# Patient Record
Sex: Male | Born: 1965 | Race: White | Hispanic: No | Marital: Married | Smoking: Never smoker
Health system: Southern US, Community
[De-identification: ages and names within clinical notes are randomized; demographics above are authoritative.]

---

## 1998-10-03 ENCOUNTER — Ambulatory Visit (HOSPITAL_COMMUNITY): Admission: RE | Admit: 1998-10-03 | Discharge: 1998-10-03 | Payer: Self-pay | Admitting: *Deleted

## 1998-10-27 ENCOUNTER — Ambulatory Visit (HOSPITAL_COMMUNITY): Admission: RE | Admit: 1998-10-27 | Discharge: 1998-10-27 | Payer: Self-pay | Admitting: *Deleted

## 2018-04-21 ENCOUNTER — Emergency Department (HOSPITAL_COMMUNITY): Payer: PRIVATE HEALTH INSURANCE

## 2018-04-21 ENCOUNTER — Other Ambulatory Visit: Payer: Self-pay

## 2018-04-21 ENCOUNTER — Emergency Department (HOSPITAL_COMMUNITY)
Admission: EM | Admit: 2018-04-21 | Discharge: 2018-04-22 | Disposition: A | Discharge status: Home / Self Care | Payer: PRIVATE HEALTH INSURANCE | Attending: Emergency Medicine | Admitting: Emergency Medicine

## 2018-04-21 DIAGNOSIS — Y9234 Swimming pool (public) as the place of occurrence of the external cause: Secondary | ICD-10-CM | POA: Diagnosis not present

## 2018-04-21 DIAGNOSIS — Z23 Encounter for immunization: Secondary | ICD-10-CM | POA: Diagnosis not present

## 2018-04-21 DIAGNOSIS — Y9301 Activity, walking, marching and hiking: Secondary | ICD-10-CM | POA: Insufficient documentation

## 2018-04-21 DIAGNOSIS — S91112A Laceration without foreign body of left great toe without damage to nail, initial encounter: Secondary | ICD-10-CM

## 2018-04-21 DIAGNOSIS — Y999 Unspecified external cause status: Secondary | ICD-10-CM | POA: Diagnosis not present

## 2018-04-21 DIAGNOSIS — S99922A Unspecified injury of left foot, initial encounter: Secondary | ICD-10-CM | POA: Diagnosis present

## 2018-04-21 DIAGNOSIS — W109XXA Fall (on) (from) unspecified stairs and steps, initial encounter: Secondary | ICD-10-CM | POA: Insufficient documentation

## 2018-04-21 MED ORDER — LIDOCAINE HCL (PF) 1 % IJ SOLN
30.0000 mL | Freq: Once | INTRAMUSCULAR | Status: AC
  Administered 2018-04-22: 30 mL
  Filled 2018-04-21: qty 30

## 2018-04-21 NOTE — ED Triage Notes (Signed)
Pt c/o left big toe pain 3/10 throbbing. Pt tripped on some steps at the pool. Bleeding controlled.

## 2018-04-22 MED ORDER — TETANUS-DIPHTH-ACELL PERTUSSIS 5-2.5-18.5 LF-MCG/0.5 IM SUSP
0.5000 mL | Freq: Once | INTRAMUSCULAR | Status: AC
  Administered 2018-04-22: 0.5 mL via INTRAMUSCULAR
  Filled 2018-04-22: qty 0.5

## 2018-04-22 NOTE — Discharge Instructions (Addendum)

## 2018-04-22 NOTE — ED Provider Notes (Signed)
[name] DEPT Provider Note   CSN: [id number] Arrival date & time: 04/21/18  2025     History   Chief Complaint Chief Complaint  Patient presents with  . Toe Injury    HPI Antonio Boyd is a 52 y.o. male with a hx of no major medical history presents to the Emergency Department complaining of acute, persistent laceration to the left great toe.  Patient reports he was walking up a set of concrete stairs when he stumbled bending his left great toe all the way back.  He reports acute pain and bleeding.  He states he immediately applied pressure and achieved hemostasis.  Unknown last tetanus shot.  He is not on a blood thinner.  No history of immunocompromise or diabetes.  No aggravating factors.  Patient did not fall or hit his head.  He denies numbness, tingling, weakness.  He reports he was able to walk afterwards.   The history is provided by the patient and medical records. No language interpreter was used.    No past medical history on file.  There are no active problems to display for this patient.    Home Medications    Prior to Admission medications   Not on File    Family History No family history on file.  Social History Social History   Tobacco Use  . Smoking status: Not on file  Substance Use Topics  . Alcohol use: Not on file  . Drug use: Not on file     Allergies   Patient has no known allergies.   Review of Systems Review of Systems  Constitutional: Negative for fever.  Gastrointestinal: Negative for nausea and vomiting.  Musculoskeletal: Positive for arthralgias. Negative for gait problem and joint swelling.  Skin: Positive for wound.  Allergic/Immunologic: Negative for immunocompromised state.  Neurological: Negative for weakness and numbness.  Hematological: Does not bruise/bleed easily.  Psychiatric/Behavioral: The patient is not nervous/anxious.      Physical Exam Updated Vital Signs BP (!) 153/113 (BP  Location: Right Arm)   Pulse 61   Temp 98.1 F (36.7 C) (Oral)   Resp 20   Ht 6\' 3"  (1.905 m)   Wt 99.8 kg (220 lb)   SpO2 95%   BMI 27.50 kg/m   Physical Exam  Constitutional: He is oriented to person, place, and time. He appears well-developed and well-nourished. No distress.  HENT:  Head: Normocephalic and atraumatic.  Eyes: Conjunctivae are normal. No scleral icterus.  Neck: Normal range of motion.  Cardiovascular: Normal rate, regular rhythm, normal heart sounds and intact distal pulses.  No murmur heard. Capillary refill < 3 sec  Pulmonary/Chest: Effort normal and breath sounds normal. No respiratory distress.  Musculoskeletal: Normal range of motion. He exhibits no edema.  Full range of motion of the left great toe 5.7 centimeter U shaped laceration to the medial portion of the plantar surface of the left great toe  Neurological: He is alert and oriented to person, place, and time.  Sensation intact to normal touch throughout the left foot. Strength 5/5 with flexion extension of the left foot and great toe  Skin: Skin is warm and dry. He is not diaphoretic.  Psychiatric: He has a normal mood and affect.  Nursing note and vitals reviewed.    ED Treatments / Results   Radiology Dg Toe Great Left  Result Date: 04/21/2018 CLINICAL DATA:  Left great toe laceration EXAM: LEFT GREAT TOE COMPARISON:  None. FINDINGS: There is no  evidence of fracture or dislocation. There is no evidence of arthropathy or other focal bone abnormality. Soft tissues are unremarkable. IMPRESSION: Negative. Electronically Signed   By: Ulyses Jarred M.D.   On: 04/21/2018 23:55    Procedures .Marland KitchenLaceration Repair Date/Time: 04/22/2018 1:05 AM Performed by: Abigail Butts, PA-C Authorized by: Abigail Butts, PA-C   Consent:    Consent obtained:  Verbal   Consent given by:  Patient   Risks discussed:  Infection, pain and poor cosmetic result   Alternatives discussed:  No  treatment Anesthesia (see MAR for exact dosages):    Anesthesia method:  Nerve block   Block location:  Left great toe   Block needle gauge:  25 G   Block anesthetic:  Lidocaine 1% w/o epi (58ml)   Block injection procedure:  Anatomic landmarks identified, anatomic landmarks palpated, introduced needle, negative aspiration for blood and incremental injection   Block outcome:  Anesthesia achieved Laceration details:    Location:  Toe   Toe location:  L big toe   Length (cm):  5.7 Repair type:    Repair type:  Simple Pre-procedure details:    Preparation:  Patient was prepped and draped in usual sterile fashion Exploration:    Hemostasis achieved with:  Direct pressure   Wound exploration: entire depth of wound probed and visualized   Treatment:    Area cleansed with:  Saline   Amount of cleaning:  Standard   Irrigation solution:  Sterile water   Irrigation volume:  280mL   Irrigation method:  Syringe   Visualized foreign bodies/material removed: yes (dirt)   Skin repair:    Repair method:  Sutures   Suture size:  5-0   Suture material:  Prolene   Suture technique:  Simple interrupted   Number of sutures:  7 Approximation:    Approximation:  Close Post-procedure details:    Dressing:  Tube gauze   Patient tolerance of procedure:  Tolerated well, no immediate complications   (including critical care time)  Medications Ordered in ED Medications  Tdap (BOOSTRIX) injection 0.5 mL (has no administration in time range)  lidocaine (PF) (XYLOCAINE) 1 % injection 30 mL (30 mLs Infiltration Given 04/22/18 0034)     Initial Impression / Assessment and Plan / ED Course  I have reviewed the triage vital signs and the nursing notes.  Pertinent labs & imaging results that were available during my care of the patient were reviewed by me and considered in my medical decision making (see chart for details).     Pressure irrigation performed. Wound explored and base of wound visualized  in a bloodless field without evidence of foreign body.  Laceration occurred < 8 hours prior to repair which was well tolerated.  Tdap updated.  Pt has no comorbidities to effect normal wound healing. Pt discharged without antibiotics.  Discussed suture home care with patient and answered questions. Pt to follow-up for wound check and suture removal in 7 days; they are to return to the ED sooner for signs of infection. Pt is hemodynamically stable with no complaints prior to dc.    Final Clinical Impressions(s) / ED Diagnoses   Final diagnoses:  Laceration of left great toe without foreign body present or damage to nail, initial encounter    ED Discharge Orders    None       Breyah Akhter, Gwenlyn Perking 04/22/18 0107    Sherwood Gambler, MD 04/22/18 [phone]

## 2019-04-25 IMAGING — DX DG TOE GREAT 2+V*L*
3 series · 3 of 3 positions shown · non-contrast
Comparison: None.

CLINICAL DATA: Left great toe laceration

EXAM:
LEFT GREAT TOE

[toe ap (1 of 3)]
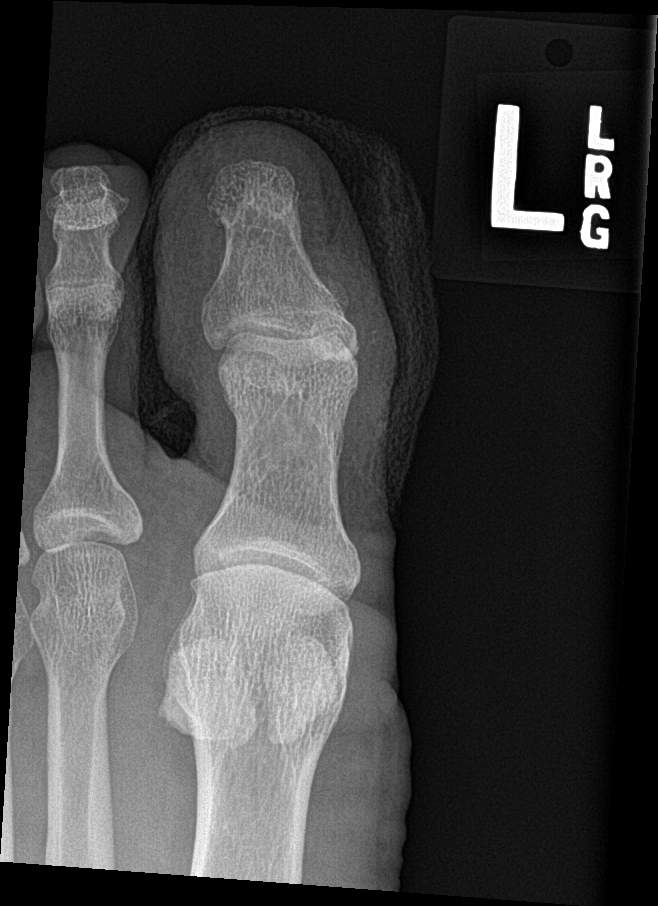

[toe ap (2 of 3)]
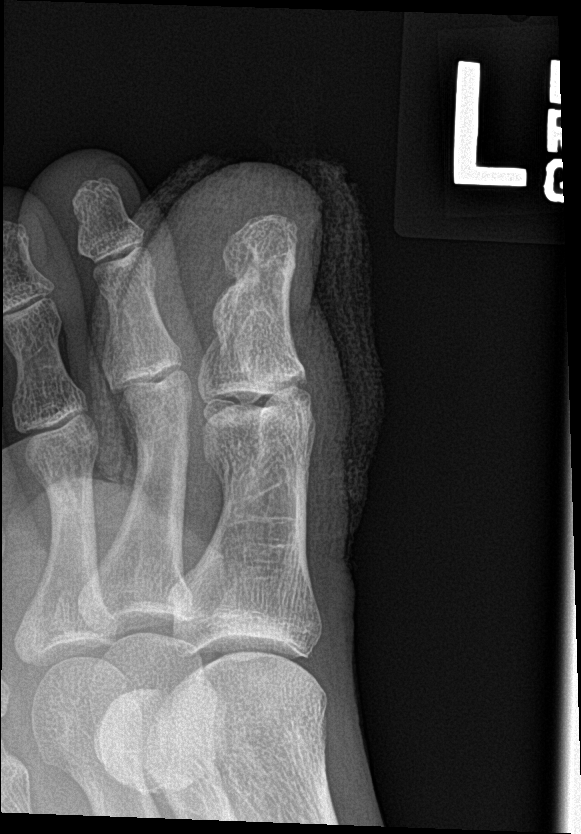

[toe ap (3 of 3)]
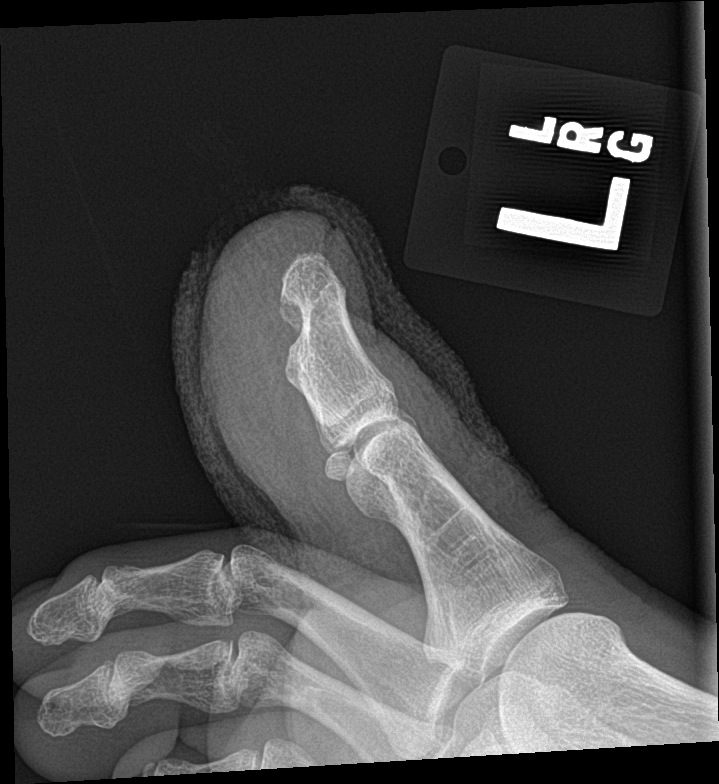

[3 of 3 positions shown; findings below may reference images not displayed]

FINDINGS: There is no evidence of fracture or dislocation. There is no
evidence of arthropathy or other focal bone abnormality. Soft
tissues are unremarkable.
IMPRESSION: Negative.

## 2021-05-25 ENCOUNTER — Encounter: Payer: Self-pay | Admitting: Plastic Surgery

## 2021-05-25 ENCOUNTER — Ambulatory Visit (INDEPENDENT_AMBULATORY_CARE_PROVIDER_SITE_OTHER): Payer: No Typology Code available for payment source | Admitting: Plastic Surgery

## 2021-05-25 ENCOUNTER — Other Ambulatory Visit: Payer: Self-pay

## 2021-05-25 DIAGNOSIS — L989 Disorder of the skin and subcutaneous tissue, unspecified: Secondary | ICD-10-CM | POA: Insufficient documentation

## 2021-05-25 DIAGNOSIS — R2232 Localized swelling, mass and lump, left upper limb: Secondary | ICD-10-CM

## 2021-05-25 NOTE — Progress Notes (Signed)
     Patient ID: Antonio Boyd, male    DOB: [birth year], 55 y.o.   MRN: [id number]   Chief Complaint  Patient presents with   consult   Skin Problem    The patient is a 55 year old male here for evaluation of his left upper arm.  The patient states he has noticed a mass on it for the past 3 years.  He sees a dermatologist regularly and was referred to [place].  The area is 5 x 5 cm in size and seems to be getting larger.  It is not tender but sometimes aggravating.  He is otherwise in good health.  No other concerning lesions.  The area is slightly firm and not mobile it seems to have the consistency of a lipoma.   Review of Systems  Constitutional: Negative.  Negative for activity change.  HENT: Negative.    Eyes: Negative.   Respiratory: Negative.  Negative for chest tightness.   Cardiovascular: Negative.  Negative for leg swelling.  Gastrointestinal: Negative.   Endocrine: Negative.   Genitourinary: Negative.   Musculoskeletal: Negative.   Skin:  Positive for color change.  Neurological: Negative.   Hematological: Negative.   Psychiatric/Behavioral: Negative.     History reviewed. No pertinent past medical history.  History reviewed. No pertinent surgical history.    Current Outpatient Medications:    lisinopril (ZESTRIL) 20 MG tablet, Take 20 mg by mouth daily., Disp: , Rfl:    montelukast (SINGULAIR) 10 MG tablet, , Disp: , Rfl:    simvastatin (ZOCOR) 20 MG tablet, , Disp: , Rfl:    Objective:   Vitals:   05/25/21 0804  BP: 125/85  Pulse: 65  SpO2: 97%    Physical Exam Vitals and nursing note reviewed.  Constitutional:      Appearance: Normal appearance.  HENT:     Head: Normocephalic and atraumatic.  Cardiovascular:     Rate and Rhythm: Normal rate.     Pulses: Normal pulses.  Pulmonary:     Effort: Pulmonary effort is normal.  Abdominal:     General: Abdomen is flat. There is no distension.  Musculoskeletal:       Arms:  Skin:    General: Skin is warm.      Capillary Refill: Capillary refill takes less than 2 seconds.     Coloration: Skin is not jaundiced.     Findings: Lesion present. No bruising.  Neurological:     General: No focal deficit present.     Mental Status: He is alert and oriented to person, place, and time.  Psychiatric:        Mood and Affect: Mood normal.        Behavior: Behavior normal.        Thought Content: Thought content normal.    Assessment & Plan:  Mass of left arm  Recommend excision of mass of left arm with path evaluation.  We can try and do this in the office.  Pictures were obtained of the patient and placed in the chart with the patient's or guardian's permission.   [place], DO

## 2021-08-17 ENCOUNTER — Other Ambulatory Visit (HOSPITAL_COMMUNITY)
Admission: RE | Admit: 2021-08-17 | Discharge: 2021-08-17 | Disposition: A | Discharge status: Home / Self Care | Payer: PRIVATE HEALTH INSURANCE | Source: Ambulatory Visit | Attending: Plastic Surgery | Admitting: Plastic Surgery

## 2021-08-17 ENCOUNTER — Ambulatory Visit (INDEPENDENT_AMBULATORY_CARE_PROVIDER_SITE_OTHER): Payer: No Typology Code available for payment source | Admitting: Plastic Surgery

## 2021-08-17 ENCOUNTER — Encounter: Payer: Self-pay | Admitting: Plastic Surgery

## 2021-08-17 ENCOUNTER — Other Ambulatory Visit: Payer: Self-pay

## 2021-08-17 VITALS — BP 132/86 | HR 65

## 2021-08-17 DIAGNOSIS — D1721 Benign lipomatous neoplasm of skin and subcutaneous tissue of right arm: Secondary | ICD-10-CM

## 2021-08-17 DIAGNOSIS — R2232 Localized swelling, mass and lump, left upper limb: Secondary | ICD-10-CM

## 2021-08-17 NOTE — Progress Notes (Signed)
Procedure Note  Preoperative Dx: mass of left arm  Postoperative Dx: Same  Procedure: Excision of mass left arm 3 x 3 cm  Anesthesia: Lidocaine 1% with 1:100,000 epinephrine  Indication for Procedure: mass  Description of Procedure: Risks and complications were explained to the patient.  Consent was confirmed and the patient understands the risks and benefits.  The potential complications and alternatives were explained and the patient consents.  The patient expressed understanding the option of not having the procedure and the risks of a scar.  Time out was called and all information was confirmed to be correct.    The area was prepped and drapped.  Lidocaine 1% with epinepherine was injected in the subcutaneous area.  After waiting several minutes for the local to take affect a #15 blade was used to incise the skin over the area.  The tissue scissors were used to release the mass.  It appeared to be adipose.   The skin edges were reapproximated with 5-0 Monocryl subcuticular running closure.  A dressing was applied.  The patient was given instructions on how to care for the area and a follow up appointment.  Antonio Boyd tolerated the procedure well and there were no complications. The specimen was sent to pathology.

## 2021-08-20 LAB — SURGICAL PATHOLOGY

## 2021-08-31 ENCOUNTER — Other Ambulatory Visit: Payer: Self-pay

## 2021-08-31 ENCOUNTER — Encounter: Payer: Self-pay | Admitting: Plastic Surgery

## 2021-08-31 ENCOUNTER — Ambulatory Visit: Payer: No Typology Code available for payment source | Admitting: Physician Assistant

## 2021-08-31 ENCOUNTER — Ambulatory Visit (INDEPENDENT_AMBULATORY_CARE_PROVIDER_SITE_OTHER): Payer: No Typology Code available for payment source | Admitting: Plastic Surgery

## 2021-08-31 DIAGNOSIS — R2232 Localized swelling, mass and lump, left upper limb: Secondary | ICD-10-CM

## 2021-08-31 NOTE — Progress Notes (Signed)
The patient is a 55 year old male here for follow-up after excision of a mass on his left arm.  The pathology was consistent with a lipoma.  The area is healing nicely.  No sign of infection.  Follow up as needed.
